# Patient Record
Sex: Male | Born: 1985 | Race: Black or African American | Hispanic: No | Marital: Single | Smoking: Never smoker
Health system: Southern US, Community
[De-identification: ages and names within clinical notes are randomized; demographics above are authoritative.]

## PROBLEM LIST (undated history)

## (undated) HISTORY — PX: EYE SURGERY: SHX253

## (undated) HISTORY — PX: OTHER SURGICAL HISTORY: SHX169

---
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
## 2005-09-13 ENCOUNTER — Emergency Department (HOSPITAL_COMMUNITY): Admission: EM | Admit: 2005-09-13 | Discharge: 2005-09-13 | Payer: Self-pay | Admitting: Emergency Medicine

## 2005-09-27 ENCOUNTER — Emergency Department (HOSPITAL_COMMUNITY): Admission: EM | Admit: 2005-09-27 | Discharge: 2005-09-27 | Payer: Self-pay | Admitting: Family Medicine

## 2005-09-29 ENCOUNTER — Emergency Department (HOSPITAL_COMMUNITY): Admission: EM | Admit: 2005-09-29 | Discharge: 2005-09-29 | Payer: Self-pay | Admitting: Emergency Medicine

## 2005-10-01 ENCOUNTER — Emergency Department (HOSPITAL_COMMUNITY): Admission: EM | Admit: 2005-10-01 | Discharge: 2005-10-02 | Payer: Self-pay | Admitting: Emergency Medicine

## 2005-10-03 ENCOUNTER — Inpatient Hospital Stay (HOSPITAL_COMMUNITY): Admission: EM | Admit: 2005-10-03 | Discharge: 2005-10-06 | Payer: Self-pay | Admitting: Emergency Medicine

## 2005-10-03 ENCOUNTER — Ambulatory Visit: Payer: Self-pay | Admitting: Family Medicine

## 2006-01-09 ENCOUNTER — Emergency Department (HOSPITAL_COMMUNITY): Admission: EM | Admit: 2006-01-09 | Discharge: 2006-01-09 | Payer: Self-pay | Admitting: Family Medicine

## 2006-02-11 ENCOUNTER — Emergency Department (HOSPITAL_COMMUNITY): Admission: EM | Admit: 2006-02-11 | Discharge: 2006-02-11 | Payer: Self-pay | Admitting: Family Medicine

## 2006-04-11 DIAGNOSIS — K589 Irritable bowel syndrome without diarrhea: Secondary | ICD-10-CM

## 2006-08-21 ENCOUNTER — Emergency Department (HOSPITAL_COMMUNITY): Admission: EM | Admit: 2006-08-21 | Discharge: 2006-08-22 | Payer: Self-pay | Admitting: Emergency Medicine

## 2006-10-09 ENCOUNTER — Emergency Department (HOSPITAL_COMMUNITY): Admission: EM | Admit: 2006-10-09 | Discharge: 2006-10-09 | Payer: Self-pay | Admitting: Emergency Medicine

## 2007-06-22 ENCOUNTER — Emergency Department (HOSPITAL_COMMUNITY): Admission: EM | Admit: 2007-06-22 | Discharge: 2007-06-23 | Payer: Self-pay | Admitting: Emergency Medicine

## 2007-06-22 ENCOUNTER — Emergency Department (HOSPITAL_COMMUNITY): Admission: EM | Admit: 2007-06-22 | Discharge: 2007-06-22 | Payer: Self-pay | Admitting: Family Medicine

## 2009-05-20 ENCOUNTER — Emergency Department (HOSPITAL_COMMUNITY): Admission: EM | Admit: 2009-05-20 | Discharge: 2009-05-21 | Payer: Self-pay | Admitting: Emergency Medicine

## 2010-05-03 LAB — CBC
HCT: 42.2 % (ref 39.0–52.0)
MCHC: 33.8 g/dL (ref 30.0–36.0)
MCV: 91.4 fL (ref 78.0–100.0)
Platelets: 169 10*3/uL (ref 150–400)
RDW: 12.6 % (ref 11.5–15.5)

## 2010-05-03 LAB — DIFFERENTIAL
Basophils Absolute: 0 10*3/uL (ref 0.0–0.1)
Basophils Relative: 0 % (ref 0–1)
Eosinophils Absolute: 0 10*3/uL (ref 0.0–0.7)
Eosinophils Relative: 0 % (ref 0–5)
Lymphocytes Relative: 9 % — ABNORMAL LOW (ref 12–46)
Monocytes Absolute: 1 10*3/uL (ref 0.1–1.0)

## 2010-05-03 LAB — POCT I-STAT, CHEM 8
Calcium, Ion: 1.08 mmol/L — ABNORMAL LOW (ref 1.12–1.32)
HCT: 43 % (ref 39.0–52.0)
Hemoglobin: 14.6 g/dL (ref 13.0–17.0)
Sodium: 138 mEq/L (ref 135–145)
TCO2: 27 mmol/L (ref 0–100)

## 2010-06-30 NOTE — Op Note (Signed)
Lawrence Harris, Lawrence Harris            ACCOUNT NO.:  [id number]   MEDICAL RECORD NO.:  [id number]          PATIENT TYPE:  INP   LOCATION:  5127                         FACILITY:  [facility]   PHYSICIAN:  Kinnie Scales. Annalee Genta, M.D.DATE OF BIRTH:  [birth year]   DATE OF PROCEDURE:  10/03/2005  DATE OF DISCHARGE:                                 OPERATIVE REPORT   PRE AND POSTOPERATIVE DIAGNOSIS AND INDICATIONS FOR SURGERY:  Lower lip  abscess.   PROCEDURES:  Incision and drainage of lower lip abscess.   SURGEON:  Kinnie Scales. Annalee Genta, M.D.   ANESTHESIA:  General.   SPECIMEN:  Including culture sensitivity sent to microbiology.   No complications.  Blood loss minimal.  The patient transferred to the  operating room to recovery in stable condition.  The patient also has  multiple additional cutaneous abscesses which were incised and drained by  Dr. Chevis Pretty as a separate surgical procedure.   BRIEF HISTORY:  The patient is a 26 year old black male without significant  past medical history. He reports 1-week history of swelling of the lower lip  and multiple additional cutaneous lesions.  He was seen as outpatient at the  [facility] outpatient family practice clinic and was cultured  diagnosed with MRSA, started on oral antibiotic therapy and despite this  continued to progress and was admitted to hospital 10/03/2005 with low grade  fever and elevated white blood cell count. Examination showed a significant  amount of swelling, erythema and tenderness of the lower lip, no other  lesions within the head and neck region.  Given his history and physical  examination,  I recommended incision and drainage of the lower lip abscess  in order to improve response to antibiotic therapy.  Risks, benefits and  possible complications of procedure were discussed in detail with the  patient's family, they understood and concurred with our plan for surgery  which is scheduled on a semi-urgent basis at  [facility] Main OR.   SURGICAL PROCEDURES:  The patient brought to the operating room in the main  OR at [facility] 10/03/2005.  General endotracheal anesthesia was  established without difficulty.  The patient's lip was cleansed with a  Betadine solution and he was examined.  Digital pressure showed significant  areas of purulent discharge along the inferior border of the lower lip at  the junction with the cutaneous skin.  He also had an area in the mid aspect  of the lip which is also leaking a moderate amount of purulent material.  A  hemostat was then passed through these areas and a significant amount of pus  was drained from the lip.  This was sent for culture and sensitivity, Gram  stain, aerobic, anaerobic cultures.  All purulent material was expressed  from the wound and there was a small amount of  bleeding.  A quarter inch Penrose drain was placed through the wound and  sutured into position with a 5-0 Ethilon suture.  The patient was awakened  from his anesthetic, extubated, transferred from the operating room to  recovery room in stable condition.  ______________________________  Kinnie Scales Annalee Genta, M.D.     DLS/MEDQ  D:  16/11/9602  T:  10/04/2005  Job:  [id number]
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
## 2010-06-30 NOTE — Discharge Summary (Signed)
NAMEHUTCH, RHETT            ACCOUNT NO.:  [id number]   MEDICAL RECORD NO.:  [id number]          PATIENT TYPE:  INP   LOCATION:  5127                         FACILITY:  [facility]   PHYSICIAN:  Rolm Gala, M.D.    DATE OF BIRTH:  1985/03/09   DATE OF ADMISSION:  10/02/2005  DATE OF DISCHARGE:                                 DISCHARGE SUMMARY   ADMISSION DATE:  October 03, 2005.   DISCHARGE DATE:  October 06, 2005.   DICTATING PHYSICIAN:  Dr. Rolm Gala.   ATTENDING PHYSICIAN:  Dr. Mardelle Matte.   PRIMARY CARE PHYSICIAN:  Dr. Rolm Gala at [facility]   CONSULTING PHYSICIAN:  1. Dr. Carolynne Edouard with [facility].  2. Dr. Annalee Genta with [facility].   DISCHARGE DIAGNOSES:  1. Multiple MRSA abscesses including 4 on abdomen, 1 on lip, and a few on      his legs.  2. History of irritable bowel syndrome.   PRINCIPAL PROCEDURES:  On October 03, 2005 ENT surgery drained the abscesses  on his abdomen, legs, and lip.   HOSPITAL COURSE:  This is a 25 year old male, who came into the ER on October 03, 2005 with multiple abscesses on his abdomen that had been minimally  drained by an urgent care center.  We were not able to drain these abscesses  ourselves due to pain tolerance.   PROBLEM #1:  MRSA abscesses.  Patient was started on vancomycin IV here to  cover for MRSA.  He was put on contact precautions.  ENT was consulted for a  abscess on his lip and surgery was consulted for abscesses on multiple  places on his body.  We were not able to drain these abscesses ourselves due  to pain tolerance.  Patient was put under general anesthesia and these were  drained.  The abscesses were packed daily.  Patient was given oxycodone,  Toradol, and Tylenol for pain.  He was discharged on Vicodin 5/500 because  he said that oxycodone makes him nauseous and Vicodin does not ([facility]).  Patient was also complaining of occasional nausea so he was discharged on  Phenergan 12.5 p.r.n.   patient was switched from vancomycin to Septra DS  p.o. on October 05, 2005, the day before discharge.  At discharge he was sent  home on Septra DS, 2 p.o. b.i.d. for a total of 10 days.  He will follow up  with both ENT and surgery.   PROBLEM #2:  Irritable bowel syndrome.  Patient was continued on his home  dose of Levsin.   FOLLOWUP:  Patient will see Dr. Carolynne Edouard in 2 weeks and Dr. Annalee Genta in 10  days.  He will follow up at the [facility] in 10 days to 2  weeks.   LABORATORY DATA:  Blood cultures from October 03, 2005 were negative.  A  routine culture from his wounds from October 03, 2005 grew MRSA that was  sensitive to Septra but not tetracycline.  His white blood cells on  admission were 25, at discharge they had come down to 10.7.   DISCHARGE MEDICATIONS:  1. Vicodin  5/500, take 1-2 q.4 p.r.n., #30.  2. Phenergan 12.5 mg, take 1 q.6 p.r.n. nausea, #5.  3. Septra DS, take 2 pills p.o. b.i.d. to finish a 10-day course, 7 days      left.  4. Levsin 0.125-0.25 mg p.o. q.6 p.r.n.      Rolm Gala, M.D.     Bennetta Laos  D:  10/06/2005  T:  10/06/2005  Job:  [id number]   cc:   Rolm Gala, M.D.  Annalee Genta, Dr.  Ollen Gross. Vernell Morgans, M.D.

## 2010-06-30 NOTE — Op Note (Signed)
NAMELAMAR, METER            ACCOUNT NO.:  [id number]   MEDICAL RECORD NO.:  [id number]          PATIENT TYPE:  INP   LOCATION:  5127                         FACILITY:  [facility]   PHYSICIAN:  Ollen Gross. Vernell Morgans, M.D. DATE OF BIRTH:  October 19, 1985   DATE OF PROCEDURE:  10/03/2005  DATE OF DISCHARGE:                                 OPERATIVE REPORT   PREOPERATIVE DIAGNOSIS:  Multiple abscesses on the abdomen, right axilla,  and bilateral legs.   POSTOPERATIVE DIAGNOSIS:  Multiple abscesses on the abdomen, right axilla,  and bilateral legs.   PROCEDURES:  Incision and drainage of each of these abscesses.   SURGEON:  Ollen Gross. Carolynne Edouard, M.D.   ANESTHESIA:  General endotracheal.   PROCEDURE:  After informed consent was obtained, the patient was brought to  the operating room and placed in supine position on the operating room  table.  After induction of general anesthesia, each of these areas of  abscess were prepped with Betadine and draped in the usual sterile manner.  Each of these abscesses was opened with a 15 blade knife.  Hemostasis was  achieved using the Bovie electrocautery.  Each of these wounds was then  cleaned with gauze and then packed with clean 2 x 2 gauze and sterile  dressings were applied.  The patient tolerated the procedure well.  At the  end of the case all needle, sponge and instrument counts were correct.  The  patient was then awakened and taken to recovery in stable condition.      Ollen Gross. Vernell Morgans, M.D.  Electronically Signed     PST/MEDQ  D:  10/04/2005  T:  10/05/2005  Job:  [id number]

## 2010-11-24 LAB — DIFFERENTIAL
Basophils Absolute: 0.1
Basophils Relative: 1
Lymphocytes Relative: 28
Monocytes Absolute: 0.7
Monocytes Relative: 8
Neutro Abs: 5.8
Neutrophils Relative %: 62

## 2010-11-24 LAB — BASIC METABOLIC PANEL
Calcium: 9
Creatinine, Ser: 0.83
GFR calc Af Amer: >60
GFR calc non Af Amer: >60
Sodium: 139

## 2010-11-24 LAB — CBC
Hemoglobin: 15.1
RBC: 4.94
RDW: 13.5

## 2013-07-16 ENCOUNTER — Encounter (HOSPITAL_BASED_OUTPATIENT_CLINIC_OR_DEPARTMENT_OTHER): Payer: Self-pay | Admitting: Emergency Medicine

## 2013-07-16 ENCOUNTER — Emergency Department (HOSPITAL_BASED_OUTPATIENT_CLINIC_OR_DEPARTMENT_OTHER)
Admission: EM | Admit: 2013-07-16 | Discharge: 2013-07-16 | Disposition: A | Discharge status: Home / Self Care | Payer: Self-pay | Attending: Emergency Medicine | Admitting: Emergency Medicine

## 2013-07-16 ENCOUNTER — Emergency Department (HOSPITAL_BASED_OUTPATIENT_CLINIC_OR_DEPARTMENT_OTHER): Payer: Self-pay

## 2013-07-16 DIAGNOSIS — W2209XA Striking against other stationary object, initial encounter: Secondary | ICD-10-CM | POA: Insufficient documentation

## 2013-07-16 DIAGNOSIS — S60221A Contusion of right hand, initial encounter: Secondary | ICD-10-CM

## 2013-07-16 DIAGNOSIS — S60229A Contusion of unspecified hand, initial encounter: Secondary | ICD-10-CM | POA: Insufficient documentation

## 2013-07-16 DIAGNOSIS — Y9289 Other specified places as the place of occurrence of the external cause: Secondary | ICD-10-CM | POA: Insufficient documentation

## 2013-07-16 DIAGNOSIS — Y9389 Activity, other specified: Secondary | ICD-10-CM | POA: Insufficient documentation

## 2013-07-16 MED ORDER — TRAMADOL HCL 50 MG PO TABS: 50.0000 mg | ORAL_TABLET | Freq: Four times a day (QID) | ORAL | Status: DC | PRN
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
## 2013-07-16 NOTE — Discharge Instructions (Signed)
Take ibuprofen or acetaminophen as needed for pain. Wear splint as needed. Follow up with the hand specialist if not improving in the next week.  Hand Contusion A hand contusion is a deep bruise on your hand area. Contusions are the result of an injury that caused bleeding under the skin. The contusion may turn blue, purple, or yellow. Minor injuries will give you a painless contusion, but more severe contusions may stay painful and swollen for a few weeks. CAUSES  A contusion is usually caused by a blow, trauma, or direct force to an area of the body. SYMPTOMS   Swelling and redness of the injured area.  Discoloration of the injured area.  Tenderness and soreness of the injured area.  Pain. DIAGNOSIS  The diagnosis can be made by taking a history and performing a physical exam. An X-ray, CT scan, or MRI may be needed to determine if there were any associated injuries, such as broken bones (fractures). TREATMENT  Often, the best treatment for a hand contusion is resting, elevating, icing, and applying cold compresses to the injured area. Over-the-counter medicines may also be recommended for pain control. HOME CARE INSTRUCTIONS   Put ice on the injured area.  Put ice in a plastic bag.  Place a towel between your skin and the bag.  Leave the ice on for 15-20 minutes, 03-04 times a day.  Only take over-the-counter or prescription medicines as directed by your caregiver. Your caregiver may recommend avoiding anti-inflammatory medicines (aspirin, ibuprofen, and naproxen) for 48 hours because these medicines may increase bruising.  If told, use an elastic wrap as directed. This can help reduce swelling. You may remove the wrap for sleeping, showering, and bathing. If your fingers become numb, cold, or blue, take the wrap off and reapply it more loosely.  Elevate your hand with pillows to reduce swelling.  Avoid overusing your hand if it is painful. SEEK IMMEDIATE MEDICAL CARE IF:    You have increased redness, swelling, or pain in your hand.  Your swelling or pain is not relieved with medicines.  You have loss of feeling in your hand or are unable to move your fingers.  Your hand turns cold or blue.  You have pain when you move your fingers.  Your hand becomes warm to the touch.  Your contusion does not improve in 2 days. MAKE SURE YOU:   Understand these instructions.  Will watch your condition.  Will get help right away if you are not doing well or get worse. Document Released: 07/21/2001 Document Revised: 10/24/2011 Document Reviewed: 07/23/2011 [facility] Patient Information 2014 [place], [place].  Tramadol tablets What is this medicine? TRAMADOL (TRA ma dole) is a pain reliever. It is used to treat moderate to severe pain in adults. This medicine may be used for other purposes; ask your health care provider or pharmacist if you have questions. COMMON BRAND NAME(S): Ultram What should I tell my health care provider before I take this medicine? They need to know if you have any of these conditions: -brain tumor -depression -drug abuse or addiction -head injury -if you frequently drink alcohol containing drinks -kidney disease or trouble passing urine -liver disease -lung disease, asthma, or breathing problems -seizures or epilepsy -suicidal thoughts, plans, or attempt; a previous suicide attempt by you or a family member -an unusual or allergic reaction to tramadol, codeine, other medicines, foods, dyes, or preservatives -pregnant or trying to get pregnant -breast-feeding How should I use this medicine? Take this medicine by mouth  with a full glass of water. Follow the directions on the prescription label. If the medicine upsets your stomach, take it with food or milk. Do not take more medicine than you are told to take. Talk to your pediatrician regarding the use of this medicine in children. Special care may be needed. Overdosage: If you think  you have taken too much of this medicine contact a poison control center or emergency room at once. NOTE: This medicine is only for you. Do not share this medicine with others. What if I miss a dose? If you miss a dose, take it as soon as you can. If it is almost time for your next dose, take only that dose. Do not take double or extra doses. What may interact with this medicine? Do not take this medicine with any of the following medications: -MAOIs like Carbex, Eldepryl, Marplan, Nardil, and Parnate This medicine may also interact with the following medications: -alcohol or medicines that contain alcohol -antihistamines -benzodiazepines -bupropion -carbamazepine or oxcarbazepine -clozapine -cyclobenzaprine -digoxin -furazolidone -linezolid -medicines for depression, anxiety, or psychotic disturbances -medicines for migraine headache like almotriptan, eletriptan, frovatriptan, naratriptan, rizatriptan, sumatriptan, zolmitriptan -medicines for pain like pentazocine, buprenorphine, butorphanol, meperidine, nalbuphine, and propoxyphene -medicines for sleep -muscle relaxants -naltrexone -phenobarbital -phenothiazines like perphenazine, thioridazine, chlorpromazine, mesoridazine, fluphenazine, prochlorperazine, promazine, and trifluoperazine -procarbazine -warfarin This list may not describe all possible interactions. Give your health care provider a list of all the medicines, herbs, non-prescription drugs, or dietary supplements you use. Also tell them if you smoke, drink alcohol, or use illegal drugs. Some items may interact with your medicine. What should I watch for while using this medicine? Tell your doctor or health care professional if your pain does not go away, if it gets worse, or if you have new or a different type of pain. You may develop tolerance to the medicine. Tolerance means that you will need a higher dose of the medicine for pain relief. Tolerance is normal and is  expected if you take this medicine for a long time. Do not suddenly stop taking your medicine because you may develop a severe reaction. Your body becomes used to the medicine. This does NOT mean you are addicted. Addiction is a behavior related to getting and using a drug for a non-medical reason. If you have pain, you have a medical reason to take pain medicine. Your doctor will tell you how much medicine to take. If your doctor wants you to stop the medicine, the dose will be slowly lowered over time to avoid any side effects. You may get drowsy or dizzy. Do not drive, use machinery, or do anything that needs mental alertness until you know how this medicine affects you. Do not stand or sit up quickly, especially if you are an older patient. This reduces the risk of dizzy or fainting spells. Alcohol can increase or decrease the effects of this medicine. Avoid alcoholic drinks. You may have constipation. Try to have a bowel movement at least every 2 to 3 days. If you do not have a bowel movement for 3 days, call your doctor or health care professional. Your mouth may get dry. Chewing sugarless gum or sucking hard candy, and drinking plenty of water may help. Contact your doctor if the problem does not go away or is severe. What side effects may I notice from receiving this medicine? Side effects that you should report to your doctor or health care professional as soon as possible: -allergic reactions like skin rash,  itching or hives, swelling of the face, lips, or tongue -breathing difficulties, wheezing -confusion -itching -light headedness or fainting spells -redness, blistering, peeling or loosening of the skin, including inside the mouth -seizures Side effects that usually do not require medical attention (report to your doctor or health care professional if they continue or are bothersome): -constipation -dizziness -drowsiness -headache -nausea, vomiting This list may not describe all  possible side effects. Call your doctor for medical advice about side effects. You may report side effects to FDA at 1-800-FDA-1088. Where should I keep my medicine? Keep out of the reach of children. Store at room temperature between 15 and 30 degrees C (59 and 86 degrees F). Keep container tightly closed. Throw away any unused medicine after the expiration date. NOTE: This sheet is a summary. It may not cover all possible information. If you have questions about this medicine, talk to your doctor, pharmacist, or health care provider.  2014, [employer]. (2009-10-12 11:55:44)

## 2013-08-10 ENCOUNTER — Encounter (HOSPITAL_BASED_OUTPATIENT_CLINIC_OR_DEPARTMENT_OTHER): Payer: Self-pay | Admitting: Emergency Medicine

## 2013-08-10 ENCOUNTER — Emergency Department (HOSPITAL_BASED_OUTPATIENT_CLINIC_OR_DEPARTMENT_OTHER)
Admission: EM | Admit: 2013-08-10 | Discharge: 2013-08-10 | Disposition: A | Discharge status: Home / Self Care | Payer: Self-pay | Attending: Emergency Medicine | Admitting: Emergency Medicine

## 2013-08-10 DIAGNOSIS — M25549 Pain in joints of unspecified hand: Secondary | ICD-10-CM | POA: Insufficient documentation

## 2013-08-10 DIAGNOSIS — M79641 Pain in right hand: Secondary | ICD-10-CM

## 2013-08-10 NOTE — ED Notes (Addendum)
Pt c/o right hand pain x 3 weeks seen here 6/4 for same , pt reports did not f/u with ortho and lost " brace"  And is out of tramadol

## 2013-08-10 NOTE — Discharge Instructions (Signed)

## 2013-08-10 NOTE — ED Provider Notes (Signed)
Medical screening examination/treatment/procedure(s) were performed by non-physician practitioner and as supervising physician I was immediately available for consultation/collaboration.   Tamarius Rosenfield, MD 08/10/13 2329 

## 2013-08-10 NOTE — ED Provider Notes (Signed)
CSN: [id number]     Arrival date & time 08/10/13  1417 History   First MD Initiated Contact with Patient 08/10/13 1538     Chief Complaint  Patient presents with  . Hand Pain     (Consider location/radiation/quality/duration/timing/severity/associated sxs/prior Treatment) HPI Comments: Pt states that after punching something a couple of weeks ago he was splinted as the xray was normal and he has lost the splint and told to follow up with ortho which he decided not to do; states that he is continuing to have pain in the hand and some times when he grips things he get shooting pains in his hand  Patient is a 28 y.o. male presenting with hand pain. The history is provided by the patient. No language interpreter was used.  Hand Pain This is a recurrent problem. The current episode started 1 to 4 weeks ago. The problem occurs constantly. The problem has been unchanged. Pertinent negatives include no fever, numbness or weakness. The symptoms are aggravated by twisting. He has tried immobilization for the symptoms.    History reviewed. No pertinent past medical history. Past Surgical History  Procedure Laterality Date  . Eye surgery     History reviewed. No pertinent family history. History  Substance Use Topics  . Smoking status: Never Smoker   . Smokeless tobacco: Not on file  . Alcohol Use: No    Review of Systems  Constitutional: Negative for fever.  Respiratory: Negative.   Cardiovascular: Negative.   Neurological: Negative for weakness and numbness.      Allergies  Review of patient's allergies indicates no known allergies.  Home Medications   Prior to Admission medications   Not on File   BP 147/83  Pulse 82  Temp(Src) 98.6 F (37 C) (Oral)  Resp 18  SpO2 98% Physical Exam  Nursing note and vitals reviewed. Constitutional: He is oriented to person, place, and time. He appears well-developed and well-nourished.  Cardiovascular: Normal rate and regular rhythm.    Pulmonary/Chest: Effort normal and breath sounds normal.  Musculoskeletal:  No obvious swelling or deformity to the right hand. Pt has full rom. Equal grip strength. Neurovascularly intact  Neurological: He is alert and oriented to person, place, and time. He exhibits normal muscle tone. Coordination normal.  Skin: Skin is warm and dry.    ED Course  Procedures (including critical care time) Labs Review Labs Reviewed - No data to display  Imaging Review No results found.   EKG Interpretation None      MDM   Final diagnoses:  Pain of right hand    Pt is neurologically intact. Pt placed in another velcro splint and referred to Dr. Pearletha Forgehudnall for follow up    Teressa LowerVrinda Pickering, NP 08/10/13 1615

## 2013-10-21 ENCOUNTER — Emergency Department (HOSPITAL_BASED_OUTPATIENT_CLINIC_OR_DEPARTMENT_OTHER)
Admission: EM | Admit: 2013-10-21 | Discharge: 2013-10-21 | Disposition: A | Discharge status: Home / Self Care | Payer: Self-pay | Attending: Emergency Medicine | Admitting: Emergency Medicine

## 2013-10-21 ENCOUNTER — Encounter (HOSPITAL_BASED_OUTPATIENT_CLINIC_OR_DEPARTMENT_OTHER): Payer: Self-pay | Admitting: Emergency Medicine

## 2013-10-21 DIAGNOSIS — Y929 Unspecified place or not applicable: Secondary | ICD-10-CM | POA: Insufficient documentation

## 2013-10-21 DIAGNOSIS — Y939 Activity, unspecified: Secondary | ICD-10-CM | POA: Insufficient documentation

## 2013-10-21 DIAGNOSIS — R112 Nausea with vomiting, unspecified: Secondary | ICD-10-CM | POA: Insufficient documentation

## 2013-10-21 DIAGNOSIS — T628X1A Toxic effect of other specified noxious substances eaten as food, accidental (unintentional), initial encounter: Secondary | ICD-10-CM | POA: Insufficient documentation

## 2013-10-21 DIAGNOSIS — A059 Bacterial foodborne intoxication, unspecified: Secondary | ICD-10-CM | POA: Insufficient documentation

## 2013-10-21 LAB — BASIC METABOLIC PANEL
ANION GAP: 10 (ref 5–15)
BUN: 12 mg/dL (ref 6–23)
CALCIUM: 9.3 mg/dL (ref 8.4–10.5)
CO2: 24 mEq/L (ref 19–32)
Chloride: 104 mEq/L (ref 96–112)
Creatinine, Ser: 0.7 mg/dL (ref 0.50–1.35)
Glucose, Bld: 111 mg/dL — ABNORMAL HIGH (ref 70–99)
Potassium: 4 mEq/L (ref 3.7–5.3)
SODIUM: 138 meq/L (ref 137–147)

## 2013-10-21 LAB — CBC WITH DIFFERENTIAL/PLATELET
BASOS ABS: 0 10*3/uL (ref 0.0–0.1)
Basophils Relative: 1 % (ref 0–1)
EOS ABS: 0.1 10*3/uL (ref 0.0–0.7)
EOS PCT: 2 % (ref 0–5)
HCT: 42.7 % (ref 39.0–52.0)
Hemoglobin: 15.1 g/dL (ref 13.0–17.0)
LYMPHS PCT: 39 % (ref 12–46)
Lymphs Abs: 2.9 10*3/uL (ref 0.7–4.0)
MCH: 30.6 pg (ref 26.0–34.0)
MCHC: 35.4 g/dL (ref 30.0–36.0)
MCV: 86.6 fL (ref 78.0–100.0)
Monocytes Absolute: 0.7 10*3/uL (ref 0.1–1.0)
Monocytes Relative: 9 % (ref 3–12)
Neutro Abs: 3.6 10*3/uL (ref 1.7–7.7)
Neutrophils Relative %: 49 % (ref 43–77)
PLATELETS: 193 10*3/uL (ref 150–400)
RBC: 4.93 MIL/uL (ref 4.22–5.81)
RDW: 12.6 % (ref 11.5–15.5)
WBC: 7.3 10*3/uL (ref 4.0–10.5)

## 2013-10-21 MED ORDER — ONDANSETRON HCL 4 MG/2ML IJ SOLN
4.0000 mg | Freq: Once | INTRAMUSCULAR | Status: AC
  Administered 2013-10-21: 4 mg via INTRAVENOUS
  Filled 2013-10-21: qty 2

## 2013-10-21 MED ORDER — PROMETHAZINE HCL 25 MG PO TABS: 25.0000 mg | ORAL_TABLET | Freq: Four times a day (QID) | ORAL | Status: DC | PRN

## 2013-10-21 MED ORDER — FENTANYL CITRATE 0.05 MG/ML IJ SOLN
100.0000 ug | Freq: Once | INTRAMUSCULAR | Status: DC
  Filled 2013-10-21: qty 2

## 2013-10-21 MED ORDER — SODIUM CHLORIDE 0.9 % IV BOLUS (SEPSIS)
1000.0000 mL | Freq: Once | INTRAVENOUS | Status: AC
  Administered 2013-10-21: 1000 mL via INTRAVENOUS

## 2013-10-21 MED ORDER — OXYCODONE-ACETAMINOPHEN 7.5-325 MG PO TABS: 1.0000 | ORAL_TABLET | ORAL | Status: DC | PRN

## 2013-10-21 NOTE — ED Provider Notes (Signed)
CSN: [id number]     Arrival date & time 10/21/13  1132 History   First MD Initiated Contact with Patient 10/21/13 1147     Chief Complaint  Patient presents with  . Nausea      HPI C/o n/v/d and abd cramps since yesterday and 0100. States he ate some shrimp that he thinks was bad. No fever.States his wife ate the shrimp and is also sick. Nobody else ate the shrimp and no one else in the house is sick.  History reviewed. No pertinent past medical history. Past Surgical History  Procedure Laterality Date  . Eye surgery     No family history on file. History  Substance Use Topics  . Smoking status: Never Smoker   . Smokeless tobacco: Not on file  . Alcohol Use: No    Review of Systems  All other systems reviewed and are negative   Allergies  Review of patient's allergies indicates no known allergies.  Home Medications   Prior to Admission medications   Medication Sig Start Date End Date Taking? Authorizing Provider  oxyCODONE-acetaminophen (PERCOCET) 7.5-325 MG per tablet Take 1 tablet by mouth every 4 (four) hours as needed for pain. 10/21/13   Nelia Shi, MD  promethazine (PHENERGAN) 25 MG tablet Take 1 tablet (25 mg total) by mouth every 6 (six) hours as needed for nausea or vomiting. 10/21/13   Nelia Shi, MD   BP 128/67  Pulse 89  Temp(Src) 98.5 F (36.9 C) (Oral)  Resp 18  Ht  (1.778 m)  Wt 213 lb 7 oz (96.815 kg)  BMI 30.63 kg/m2  SpO2 100% Physical Exam Physical Exam  Nursing note and vitals reviewed. Constitutional: He is oriented to person, place, and time. He appears well-developed and well-nourished. No distress.  HENT:  Head: Normocephalic and atraumatic.  Eyes: Pupils are equal, round, and reactive to light.  Neck: Normal range of motion.  Cardiovascular: Normal rate and intact distal pulses.   Pulmonary/Chest: No respiratory distress.  Abdominal: Normal appearance. He exhibits no distension.  Musculoskeletal: Normal range of motion.   Neurological: He is alert and oriented to person, place, and time. No cranial nerve deficit.  Skin: Skin is warm and dry. No rash noted.  Psychiatric: He has a normal mood and affect. His behavior is normal.   ED Course  Procedures (including critical care time) Medications  sodium chloride 0.9 % bolus 1,000 mL (1,000 mLs Intravenous New Bag/Given 10/21/13 1226)  ondansetron (ZOFRAN) injection 4 mg (4 mg Intravenous Given 10/21/13 1219)    Labs Review Labs Reviewed  BASIC METABOLIC PANEL - Abnormal; Notable for the following:    Glucose, Bld 111 (*)    All other components within normal limits  CBC WITH DIFFERENTIAL         MDM   Final diagnoses:  Food poisoning        Nelia Shi, MD 10/21/13 1236

## 2013-10-21 NOTE — ED Notes (Addendum)
C/o n/v/d and abd cramps since yesterday and 0100. States he ate some shrimp that he thinks was bad. No fever.States his wife ate the shrimp and is also sick. Nobody else ate the shrimp and no one else in the house is sick.

## 2013-10-21 NOTE — Discharge Instructions (Signed)
Food Poisoning °Food poisoning is an illness caused by something you ate or drank. There are over 250 known causes of food poisoning. However, many other causes are unknown. You can be treated even if the exact cause of your food poisoning is not known. In most cases, food poisoning is mild and lasts 1 to 2 days. However, some cases can be serious, especially for people with low immune systems, the elderly, children and infants, and pregnant women. °CAUSES  °Poor personal hygiene, improper cleaning of storage and preparation areas, and unclean utensils can cause infection or tainting (contamination) of foods. The causes of food poisoning are numerous. Infectious agents, such as viruses, bacteria, or parasites, can cause harm by infecting the intestine and disrupting the absorption of nutrients and water. This can cause diarrhea and lead to dehydration. Viruses are responsible for most of the food poisonings in which an agent is found. Parasites are less likely to cause food poisoning. Toxic agents, such as poisonous mushrooms, marine algae, and pesticides can also cause food poisoning. °· Viral causes of food poisoning include: °¨ Norovirus. °¨ Rotavirus. °¨ Hepatitis A. °· Bacterial causes of food poisoning include: °¨ Salmonellae. °¨ Campylobacter. °¨ Bacillus cereus. °¨ Escherichia coli (E. coli). °¨ Shigella. °¨ Listeria monocytogenes. °¨ Clostridium botulinum (botulism). °¨ Vibrio cholerae. °· Parasites that can cause food poisoning include: °¨ Giardia. °¨ Cryptosporidium. °¨ Toxoplasma. °SYMPTOMS °Symptoms may appear several hours or longer after consuming the contaminated food or drink. Symptoms may include: °· Nausea. °· Vomiting. °· Cramping. °· Diarrhea. °· Fever and chills. °· Muscle aches. °DIAGNOSIS °Your health care provider may be able to diagnose food poisoning from a list of what you have recently eaten and results from lab tests. Diagnostic tests may include an exam of the feces. °TREATMENT °In  most cases, treatment focuses on helping to relieve your symptoms and staying well hydrated. Antibiotic medicines are rarely needed. In severe cases, hospitalization may be required. °HOME CARE INSTRUCTIONS  °· Drink enough water and fluids to keep your urine clear or pale yellow. Drink small amounts of fluids frequently and increase as tolerated. °· Ask your health care provider for specific rehydration instructions. °· Avoid: °¨ Foods high in sugar. °¨ Alcohol. °¨ Carbonated drinks. °¨ Tobacco. °¨ Juice. °¨ Caffeine drinks. °¨ Extremely hot or cold fluids. °¨ Fatty, greasy foods. °¨ Too much intake of anything at one time. °¨ Dairy products until 24 to 48 hours after diarrhea stops. °· You may consume probiotics. Probiotics are active cultures of beneficial bacteria. They may lessen the amount and number of diarrheal stools in adults. Probiotics can be found in yogurt with active cultures and in supplements. °· Wash your hands well to avoid spreading the bacteria. °· Take medicines only as directed by your health care provider. Do not give your child aspirin because of the association with Reye's syndrome. °· Ask your health care provider if you should continue to take your regular prescribed and over-the-counter medicines. °PREVENTION  °· Wash your hands, food preparation surfaces, and utensils thoroughly before and after handling raw foods. °· Keep refrigerated foods below 40°F (5°C). °· Serve hot foods immediately or keep them heated above 140°F (60°C). °· Divide large volumes of food into small portions for rapid cooling in the refrigerator. Hot, bulky foods in the refrigerator can raise the temperature of other foods that have already cooled. °· Follow approved canning procedures. °· Heat canned foods thoroughly before tasting. °· When in doubt, throw it out. °· Infants, the elderly, women   who are pregnant, and people with compromised immune systems are especially susceptible to food poisoning. These people  should never consume unpasteurized cheese, unpasteurized cider, raw fish, raw seafood, or raw meat-type products. °SEEK IMMEDIATE MEDICAL CARE IF:  °· You have difficulty breathing, swallowing, talking, or moving. °· You develop blurred vision. °· You are unable to keep fluids down. °· You faint or nearly faint. °· Your eyes turn yellow. °· Vomiting or diarrhea develops or becomes persistent. °· Abdominal pain develops, increases, or localizes in one small area. °· You have a fever. °· The diarrhea becomes excessive or contains blood or mucus. °· You develop excessive weakness, dizziness, or extreme thirst. °· You have no urine for 8 hours. °MAKE SURE YOU:  °· Understand these instructions. °· Will watch your condition. °· Will get help right away if you are not doing well or get worse. °Document Released: 10/28/2003 Document Revised: 06/15/2013 Document Reviewed: 06/15/2010 °[employer]® Patient Information ©2015 [employer]. This information is not intended to replace advice given to you by your health care provider. Make sure you discuss any questions you have with your health care provider. ° °

## 2013-12-08 ENCOUNTER — Emergency Department (HOSPITAL_BASED_OUTPATIENT_CLINIC_OR_DEPARTMENT_OTHER): Payer: Self-pay

## 2013-12-08 ENCOUNTER — Encounter (HOSPITAL_BASED_OUTPATIENT_CLINIC_OR_DEPARTMENT_OTHER): Payer: Self-pay | Admitting: Emergency Medicine

## 2013-12-08 ENCOUNTER — Emergency Department (HOSPITAL_BASED_OUTPATIENT_CLINIC_OR_DEPARTMENT_OTHER)
Admission: EM | Admit: 2013-12-08 | Discharge: 2013-12-08 | Disposition: A | Discharge status: Home / Self Care | Payer: Self-pay | Attending: Emergency Medicine | Admitting: Emergency Medicine

## 2013-12-08 DIAGNOSIS — S39012A Strain of muscle, fascia and tendon of lower back, initial encounter: Secondary | ICD-10-CM | POA: Insufficient documentation

## 2013-12-08 DIAGNOSIS — M545 Low back pain, unspecified: Secondary | ICD-10-CM

## 2013-12-08 DIAGNOSIS — Y92410 Unspecified street and highway as the place of occurrence of the external cause: Secondary | ICD-10-CM | POA: Insufficient documentation

## 2013-12-08 DIAGNOSIS — Y9389 Activity, other specified: Secondary | ICD-10-CM | POA: Insufficient documentation

## 2013-12-08 DIAGNOSIS — R52 Pain, unspecified: Secondary | ICD-10-CM

## 2013-12-08 LAB — URINALYSIS, ROUTINE W REFLEX MICROSCOPIC
Bilirubin Urine: NEGATIVE
Glucose, UA: NEGATIVE mg/dL
Hgb urine dipstick: NEGATIVE
KETONES UR: NEGATIVE mg/dL
LEUKOCYTES UA: NEGATIVE
NITRITE: NEGATIVE
PROTEIN: NEGATIVE mg/dL
Specific Gravity, Urine: 1.026 (ref 1.005–1.030)
Urobilinogen, UA: 1 mg/dL (ref 0.0–1.0)
pH: 5.5 (ref 5.0–8.0)

## 2013-12-08 MED ORDER — IBUPROFEN 400 MG PO TABS
400.0000 mg | ORAL_TABLET | Freq: Once | ORAL | Status: AC
  Administered 2013-12-08: 400 mg via ORAL
  Filled 2013-12-08: qty 1

## 2013-12-08 MED ORDER — HYDROCODONE-ACETAMINOPHEN 5-325 MG PO TABS: 1.0000 | ORAL_TABLET | Freq: Four times a day (QID) | ORAL | Status: DC | PRN

## 2013-12-08 MED ORDER — HYDROCODONE-ACETAMINOPHEN 5-325 MG PO TABS
2.0000 | ORAL_TABLET | Freq: Once | ORAL | Status: DC
Start: 2013-12-08 — End: 2013-12-08
  Filled 2013-12-08: qty 2
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
## 2013-12-08 NOTE — Discharge Instructions (Signed)
It was our pleasure to provide your ER care today - we hope that you feel better.  Take motrin or aleve as need for pain. You may also take hydrocodone as need for pain. No driving for the next 6 hours or when taking hydrocodone. Also, do not take tylenol or acetaminophen containing medication when taking hydrocodone. Follow up with primary care doctor in 1 week if symptoms fail to improve/resolve.  Return to ER if worse, new symptoms, severe or intractable pain, fevers, numbness/weakness, other concern.    Lumbosacral Strain Lumbosacral strain is a strain of any of the parts that make up your lumbosacral vertebrae. Your lumbosacral vertebrae are the bones that make up the lower third of your backbone. Your lumbosacral vertebrae are held together by muscles and tough, fibrous tissue (ligaments).  CAUSES  A sudden blow to your back can cause lumbosacral strain. Also, anything that causes an excessive stretch of the muscles in the low back can cause this strain. This is typically seen when people exert themselves strenuously, fall, lift heavy objects, bend, or crouch repeatedly. RISK FACTORS  Physically demanding work.  Participation in pushing or pulling sports or sports that require a sudden twist of the back (tennis, golf, baseball).  Weight lifting.  Excessive lower back curvature.  Forward-tilted pelvis.  Weak back or abdominal muscles or both.  Tight hamstrings. SIGNS AND SYMPTOMS  Lumbosacral strain may cause pain in the area of your injury or pain that moves (radiates) down your leg.  DIAGNOSIS Your health care provider can often diagnose lumbosacral strain through a physical exam. In some cases, you may need tests such as X-ray exams.  TREATMENT  Treatment for your lower back injury depends on many factors that your clinician will have to evaluate. However, most treatment will include the use of anti-inflammatory medicines. HOME CARE INSTRUCTIONS   Avoid hard physical  activities (tennis, racquetball, waterskiing) if you are not in proper physical condition for it. This may aggravate or create problems.  If you have a back problem, avoid sports requiring sudden body movements. Swimming and walking are generally safer activities.  Maintain good posture.  Maintain a healthy weight.  For acute conditions, you may put ice on the injured area.  Put ice in a plastic bag.  Place a towel between your skin and the bag.  Leave the ice on for 20 minutes, 2-3 times a day.  When the low back starts healing, stretching and strengthening exercises may be recommended. SEEK MEDICAL CARE IF:  Your back pain is getting worse.  You experience severe back pain not relieved with medicines. SEEK IMMEDIATE MEDICAL CARE IF:   You have numbness, tingling, weakness, or problems with the use of your arms or legs.  There is a change in bowel or bladder control.  You have increasing pain in any area of the body, including your belly (abdomen).  You notice shortness of breath, dizziness, or feel faint.  You feel sick to your stomach (nauseous), are throwing up (vomiting), or become sweaty.  You notice discoloration of your toes or legs, or your feet get very cold. MAKE SURE YOU:   Understand these instructions.  Will watch your condition.  Will get help right away if you are not doing well or get worse. Document Released: 11/08/2004 Document Revised: 02/03/2013 Document Reviewed: 09/17/2012 [facility] Patient Information 2015 [place], [place]. This information is not intended to replace advice given to you by your health care provider. Make sure you discuss any questions you have  with your health care provider.   Back Pain, Adult Low back pain is very common. About 1 in 5 people have back pain.The cause of low back pain is rarely dangerous. The pain often gets better over time.About half of people with a sudden onset of back pain feel better in just 2 weeks. About 8  in 10 people feel better by 6 weeks.  CAUSES Some common causes of back pain include:  Strain of the muscles or ligaments supporting the spine.  Wear and tear (degeneration) of the spinal discs.  Arthritis.  Direct injury to the back. DIAGNOSIS Most of the time, the direct cause of low back pain is not known.However, back pain can be treated effectively even when the exact cause of the pain is unknown.Answering your caregiver's questions about your overall health and symptoms is one of the most accurate ways to make sure the cause of your pain is not dangerous. If your caregiver needs more information, he or she may order lab work or imaging tests (X-rays or MRIs).However, even if imaging tests show changes in your back, this usually does not require surgery. HOME CARE INSTRUCTIONS For many people, back pain returns.Since low back pain is rarely dangerous, it is often a condition that people can learn to [facility]ageon their own.   Remain active. It is stressful on the back to sit or stand in one place. Do not sit, drive, or stand in one place for more than 30 minutes at a time. Take short walks on level surfaces as soon as pain allows.Try to increase the length of time you walk each day.  Do not stay in bed.Resting more than 1 or 2 days can delay your recovery.  Do not avoid exercise or work.Your body is made to move.It is not dangerous to be active, even though your back may hurt.Your back will likely heal faster if you return to being active before your pain is gone.  Pay attention to your body when you bend and lift. Many people have less discomfortwhen lifting if they bend their knees, keep the load close to their bodies,and avoid twisting. Often, the most comfortable positions are those that put less stress on your recovering back.  Find a comfortable position to sleep. Use a firm mattress and lie on your side with your knees slightly bent. If you lie on your back, put a pillow  under your knees.  Only take over-the-counter or prescription medicines as directed by your caregiver. Over-the-counter medicines to reduce pain and inflammation are often the most helpful.Your caregiver may prescribe muscle relaxant drugs.These medicines help dull your pain so you can more quickly return to your normal activities and healthy exercise.  Put ice on the injured area.  Put ice in a plastic bag.  Place a towel between your skin and the bag.  Leave the ice on for 15-20 minutes, 03-04 times a day for the first 2 to 3 days. After that, ice and heat may be alternated to reduce pain and spasms.  Ask your caregiver about trying back exercises and gentle massage. This may be of some benefit.  Avoid feeling anxious or stressed.Stress increases muscle tension and can worsen back pain.It is important to recognize when you are anxious or stressed and learn ways to manage it.Exercise is a great option. SEEK MEDICAL CARE IF:  You have pain that is not relieved with rest or medicine.  You have pain that does not improve in 1 week.  You have new symptoms.  You are generally not feeling well. SEEK IMMEDIATE MEDICAL CARE IF:   You have pain that radiates from your back into your legs.  You develop new bowel or bladder control problems.  You have unusual weakness or numbness in your arms or legs.  You develop nausea or vomiting.  You develop abdominal pain.  You feel faint. Document Released: 01/29/2005 Document Revised: 07/31/2011 Document Reviewed: 06/02/2013 [facility] Patient Information 2015 [place], [place]. This information is not intended to replace advice given to you by your health care provider. Make sure you discuss any questions you have with your health care provider.

## 2014-01-18 ENCOUNTER — Encounter (HOSPITAL_BASED_OUTPATIENT_CLINIC_OR_DEPARTMENT_OTHER): Payer: Self-pay | Admitting: Emergency Medicine

## 2014-01-18 ENCOUNTER — Emergency Department (HOSPITAL_BASED_OUTPATIENT_CLINIC_OR_DEPARTMENT_OTHER)
Admission: EM | Admit: 2014-01-18 | Discharge: 2014-01-19 | Disposition: A | Discharge status: Home / Self Care | Payer: Self-pay | Attending: Emergency Medicine | Admitting: Emergency Medicine

## 2014-01-18 ENCOUNTER — Emergency Department (HOSPITAL_BASED_OUTPATIENT_CLINIC_OR_DEPARTMENT_OTHER): Payer: Self-pay

## 2014-01-18 DIAGNOSIS — R52 Pain, unspecified: Secondary | ICD-10-CM

## 2014-01-18 DIAGNOSIS — M549 Dorsalgia, unspecified: Secondary | ICD-10-CM | POA: Insufficient documentation

## 2014-01-18 DIAGNOSIS — K2970 Gastritis, unspecified, without bleeding: Secondary | ICD-10-CM | POA: Insufficient documentation

## 2014-01-18 MED ORDER — GI COCKTAIL ~~LOC~~
30.0000 mL | Freq: Once | ORAL | Status: AC
  Administered 2014-01-19: 30 mL via ORAL
  Filled 2014-01-18: qty 30

## 2014-01-18 NOTE — ED Notes (Signed)
Pt states that last Thursday at work he started having CP, back pain and on Sunday he called here and they advised him to come in but his wife was asleep and he just went back to bed.
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
## 2014-01-19 LAB — CBC WITH DIFFERENTIAL/PLATELET
BASOS ABS: 0 10*3/uL (ref 0.0–0.1)
Basophils Relative: 0 % (ref 0–1)
Eosinophils Absolute: 0.2 10*3/uL (ref 0.0–0.7)
Eosinophils Relative: 2 % (ref 0–5)
HEMATOCRIT: 40.7 % (ref 39.0–52.0)
Hemoglobin: 13.8 g/dL (ref 13.0–17.0)
Lymphocytes Relative: 33 % (ref 12–46)
Lymphs Abs: 3.6 10*3/uL (ref 0.7–4.0)
MCH: 30.3 pg (ref 26.0–34.0)
MCHC: 33.9 g/dL (ref 30.0–36.0)
MCV: 89.5 fL (ref 78.0–100.0)
MONO ABS: 1.2 10*3/uL — AB (ref 0.1–1.0)
Monocytes Relative: 11 % (ref 3–12)
NEUTROS ABS: 5.8 10*3/uL (ref 1.7–7.7)
NEUTROS PCT: 54 % (ref 43–77)
Platelets: 196 10*3/uL (ref 150–400)
RBC: 4.55 MIL/uL (ref 4.22–5.81)
RDW: 12.7 % (ref 11.5–15.5)
WBC: 10.7 10*3/uL — AB (ref 4.0–10.5)

## 2014-01-19 LAB — COMPREHENSIVE METABOLIC PANEL
ALBUMIN: 3.5 g/dL (ref 3.5–5.2)
ALT: 19 U/L (ref 0–53)
AST: 24 U/L (ref 0–37)
Alkaline Phosphatase: 98 U/L (ref 39–117)
Anion gap: 11 (ref 5–15)
BILIRUBIN TOTAL: 0.3 mg/dL (ref 0.3–1.2)
BUN: 12 mg/dL (ref 6–23)
CHLORIDE: 104 meq/L (ref 96–112)
CO2: 26 mEq/L (ref 19–32)
CREATININE: 0.8 mg/dL (ref 0.50–1.35)
Calcium: 8.7 mg/dL (ref 8.4–10.5)
GFR calc Af Amer: >90 mL/min (ref >90)
Glucose, Bld: 100 mg/dL — ABNORMAL HIGH (ref 70–99)
Potassium: 3.9 mEq/L (ref 3.7–5.3)
Sodium: 141 mEq/L (ref 137–147)
Total Protein: 7.1 g/dL (ref 6.0–8.3)

## 2014-01-19 LAB — LIPASE, BLOOD: LIPASE: 33 U/L (ref 11–59)

## 2014-01-19 MED ORDER — SUCRALFATE 1 GM/10ML PO SUSP: 1.0000 g | Freq: Three times a day (TID) | ORAL | Status: DC

## 2014-01-19 MED ORDER — OMEPRAZOLE 40 MG PO CPDR: 40.0000 mg | DELAYED_RELEASE_CAPSULE | Freq: Every day | ORAL | Status: DC

## 2014-01-22 ENCOUNTER — Encounter (HOSPITAL_COMMUNITY): Payer: Self-pay | Admitting: Emergency Medicine

## 2014-01-22 ENCOUNTER — Emergency Department (HOSPITAL_COMMUNITY)
Admission: EM | Admit: 2014-01-22 | Discharge: 2014-01-22 | Disposition: A | Discharge status: Home / Self Care | Payer: Self-pay | Attending: Emergency Medicine | Admitting: Emergency Medicine

## 2014-01-22 DIAGNOSIS — R079 Chest pain, unspecified: Secondary | ICD-10-CM | POA: Insufficient documentation

## 2014-01-22 DIAGNOSIS — Z79899 Other long term (current) drug therapy: Secondary | ICD-10-CM | POA: Insufficient documentation

## 2014-01-22 DIAGNOSIS — R55 Syncope and collapse: Secondary | ICD-10-CM | POA: Insufficient documentation

## 2014-01-22 DIAGNOSIS — K219 Gastro-esophageal reflux disease without esophagitis: Secondary | ICD-10-CM | POA: Insufficient documentation

## 2014-01-22 LAB — CBC WITH DIFFERENTIAL/PLATELET
BASOS PCT: 1 % (ref 0–1)
Basophils Absolute: 0 10*3/uL (ref 0.0–0.1)
EOS PCT: 1 % (ref 0–5)
Eosinophils Absolute: 0.1 10*3/uL (ref 0.0–0.7)
HEMATOCRIT: 42.3 % (ref 39.0–52.0)
HEMOGLOBIN: 14.7 g/dL (ref 13.0–17.0)
Lymphocytes Relative: 32 % (ref 12–46)
Lymphs Abs: 1.7 10*3/uL (ref 0.7–4.0)
MCH: 30.1 pg (ref 26.0–34.0)
MCHC: 34.8 g/dL (ref 30.0–36.0)
MCV: 86.5 fL (ref 78.0–100.0)
MONO ABS: 0.5 10*3/uL (ref 0.1–1.0)
MONOS PCT: 10 % (ref 3–12)
NEUTROS ABS: 3 10*3/uL (ref 1.7–7.7)
Neutrophils Relative %: 56 % (ref 43–77)
Platelets: 214 10*3/uL (ref 150–400)
RBC: 4.89 MIL/uL (ref 4.22–5.81)
RDW: 12.5 % (ref 11.5–15.5)
WBC: 5.3 10*3/uL (ref 4.0–10.5)

## 2014-01-22 LAB — BASIC METABOLIC PANEL
Anion gap: 12 (ref 5–15)
BUN: 8 mg/dL (ref 6–23)
CALCIUM: 9 mg/dL (ref 8.4–10.5)
CHLORIDE: 105 meq/L (ref 96–112)
CO2: 22 mEq/L (ref 19–32)
CREATININE: 0.7 mg/dL (ref 0.50–1.35)
GFR calc Af Amer: >90 mL/min (ref >90)
GFR calc non Af Amer: >90 mL/min (ref >90)
Glucose, Bld: 103 mg/dL — ABNORMAL HIGH (ref 70–99)
Potassium: 4.5 mEq/L (ref 3.7–5.3)
Sodium: 139 mEq/L (ref 137–147)

## 2014-01-22 LAB — I-STAT TROPONIN, ED: Troponin i, poc: 0 ng/mL (ref 0.00–0.08)

## 2014-01-22 LAB — D-DIMER, QUANTITATIVE (NOT AT ARMC): D-Dimer, Quant: <0.27 ug/mL-FEU (ref 0.00–0.48)

## 2014-01-22 MED ORDER — SODIUM CHLORIDE 0.9 % IV BOLUS (SEPSIS)
1000.0000 mL | Freq: Once | INTRAVENOUS | Status: AC
  Administered 2014-01-22: 1000 mL via INTRAVENOUS
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
## 2014-01-22 NOTE — Discharge Instructions (Signed)
Syncope °Syncope is a medical term for fainting or passing out. This means you lose consciousness and drop to the ground. People are generally unconscious for less than 5 minutes. You may have some muscle twitches for up to 15 seconds before waking up and returning to normal. Syncope occurs more often in older adults, but it can happen to anyone. While most causes of syncope are not dangerous, syncope can be a sign of a serious medical problem. It is important to seek medical care.  °CAUSES  °Syncope is caused by a sudden drop in blood flow to the brain. The specific cause is often not determined. Factors that can bring on syncope include: °· Taking medicines that lower blood pressure. °· Sudden changes in posture, such as standing up quickly. °· Taking more medicine than prescribed. °· Standing in one place for too long. °· Seizure disorders. °· Dehydration and excessive exposure to heat. °· Low blood sugar (hypoglycemia). °· Straining to have a bowel movement. °· Heart disease, irregular heartbeat, or other circulatory problems. °· Fear, emotional distress, seeing blood, or severe pain. °SYMPTOMS  °Right before fainting, you may: °· Feel dizzy or light-headed. °· Feel nauseous. °· See all white or all black in your field of vision. °· Have cold, clammy skin. °DIAGNOSIS  °Your health care provider will ask about your symptoms, perform a physical exam, and perform an electrocardiogram (ECG) to record the electrical activity of your heart. Your health care provider may also perform other heart or blood tests to determine the cause of your syncope which may include: °· Transthoracic echocardiogram (TTE). During echocardiography, sound waves are used to evaluate how blood flows through your heart. °· Transesophageal echocardiogram (TEE). °· Cardiac monitoring. This allows your health care provider to monitor your heart rate and rhythm in real time. °· Holter monitor. This is a portable device that records your  heartbeat and can help diagnose heart arrhythmias. It allows your health care provider to track your heart activity for several days, if needed. °· Stress tests by exercise or by giving medicine that makes the heart beat faster. °TREATMENT  °In most cases, no treatment is needed. Depending on the cause of your syncope, your health care provider may recommend changing or stopping some of your medicines. °HOME CARE INSTRUCTIONS °· Have someone stay with you until you feel stable. °· Do not drive, use machinery, or play sports until your health care provider says it is okay. °· Keep all follow-up appointments as directed by your health care provider. °· Lie down right away if you start feeling like you might faint. Breathe deeply and steadily. Wait until all the symptoms have passed. °· Drink enough fluids to keep your urine clear or pale yellow. °· If you are taking blood pressure or heart medicine, get up slowly and take several minutes to sit and then stand. This can reduce dizziness. °SEEK IMMEDIATE MEDICAL CARE IF:  °· You have a severe headache. °· You have unusual pain in the chest, abdomen, or back. °· You are bleeding from your mouth or rectum, or you have black or tarry stool. °· You have an irregular or very fast heartbeat. °· You have pain with breathing. °· You have repeated fainting or seizure-like jerking during an episode. °· You faint when sitting or lying down. °· You have confusion. °· You have trouble walking. °· You have severe weakness. °· You have vision problems. °If you fainted, call your local emergency services (911 in [place] Do not drive   yourself to the hospital.  °MAKE SURE YOU: °· Understand these instructions. °· Will watch your condition. °· Will get help right away if you are not doing well or get worse. °Document Released: 01/29/2005 Document Revised: 02/03/2013 Document Reviewed: 03/30/2011 °Exit[employer]® Patient Information ©2015 [employer]. This information is not intended to replace  advice given to you by your health care provider. Make sure you discuss any questions you have with your health care provider. ° °

## 2014-01-22 NOTE — ED Notes (Signed)
EDP at bedside  

## 2014-01-22 NOTE — ED Provider Notes (Signed)
Patient [facility] in the Emergency Department [facility] Patient reports chest pain/syncope Exam : awake/alert, no murmurs noted, vitals unremarkable Plan: d-dimer to r/o PE No family h/o sudden death No h/o exertional syncope previously EKG reviewed Recent CXR reviewed and negative    Joya Gaskinsonald W Brailee Riede, MD 01/22/14 1251
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
## 2014-04-06 ENCOUNTER — Emergency Department (HOSPITAL_BASED_OUTPATIENT_CLINIC_OR_DEPARTMENT_OTHER)
Admission: EM | Admit: 2014-04-06 | Discharge: 2014-04-06 | Disposition: A | Discharge status: Home / Self Care | Payer: BLUE CROSS/BLUE SHIELD | Attending: Emergency Medicine | Admitting: Emergency Medicine

## 2014-04-06 ENCOUNTER — Encounter (HOSPITAL_BASED_OUTPATIENT_CLINIC_OR_DEPARTMENT_OTHER): Payer: Self-pay | Admitting: *Deleted

## 2014-04-06 DIAGNOSIS — M62830 Muscle spasm of back: Secondary | ICD-10-CM | POA: Insufficient documentation

## 2014-04-06 DIAGNOSIS — Y9289 Other specified places as the place of occurrence of the external cause: Secondary | ICD-10-CM | POA: Diagnosis not present

## 2014-04-06 DIAGNOSIS — Y998 Other external cause status: Secondary | ICD-10-CM | POA: Diagnosis not present

## 2014-04-06 DIAGNOSIS — Z79899 Other long term (current) drug therapy: Secondary | ICD-10-CM | POA: Diagnosis not present

## 2014-04-06 DIAGNOSIS — W01198A Fall on same level from slipping, tripping and stumbling with subsequent striking against other object, initial encounter: Secondary | ICD-10-CM | POA: Insufficient documentation

## 2014-04-06 DIAGNOSIS — S3992XA Unspecified injury of lower back, initial encounter: Secondary | ICD-10-CM | POA: Insufficient documentation

## 2014-04-06 DIAGNOSIS — Y9389 Activity, other specified: Secondary | ICD-10-CM | POA: Diagnosis not present

## 2014-04-06 DIAGNOSIS — M62838 Other muscle spasm: Secondary | ICD-10-CM

## 2014-04-06 MED ORDER — NAPROXEN 250 MG PO TABS
500.0000 mg | ORAL_TABLET | Freq: Once | ORAL | Status: AC
  Administered 2014-04-06: 500 mg via ORAL
  Filled 2014-04-06: qty 2

## 2014-04-06 MED ORDER — NAPROXEN 375 MG PO TABS: 375.0000 mg | ORAL_TABLET | Freq: Two times a day (BID) | ORAL | Status: DC

## 2014-04-06 MED ORDER — METHOCARBAMOL 500 MG PO TABS: 500.0000 mg | ORAL_TABLET | Freq: Two times a day (BID) | ORAL | Status: DC

## 2014-04-06 MED ORDER — METHOCARBAMOL 500 MG PO TABS
1000.0000 mg | ORAL_TABLET | Freq: Once | ORAL | Status: AC
  Administered 2014-04-06: 1000 mg via ORAL
  Filled 2014-04-06: qty 2
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
## 2014-04-06 NOTE — ED Notes (Signed)
Fell 2 days a go from [employer]  C/o left lower back pain

## 2014-04-06 NOTE — Discharge Instructions (Signed)
Heat Therapy °Heat therapy can help make painful, stiff muscles and joints feel better. Do not use heat on new injuries. Wait at least 48 hours after an injury to use heat. Do not use heat when you have aches or pains right after an activity. If you still have pain 3 hours after stopping the activity, then you may use heat. °HOME CARE °Wet heat pack °· Soak a clean towel in warm water. Squeeze out the extra water. °· Put the warm, wet towel in a plastic bag. °· Place a thin, dry towel between your skin and the bag. °· Put the heat pack on the area for 5 minutes, and check your skin. Your skin may be pink, but it should not be red. °· Leave the heat pack on the area for 15 to 30 minutes. °· Repeat this every 2 to 4 hours while awake. Do not use heat while you are sleeping. °Warm water bath °· Fill a tub with warm water. °· Place the affected body part in the tub. °· Soak the area for 20 to 40 minutes. °· Repeat as needed. °Hot water bottle °· Fill the water bottle half full with hot water. °· Press out the extra air. Close the cap tightly. °· Place a dry towel between your skin and the bottle. °· Put the bottle on the area for 5 minutes, and check your skin. Your skin may be pink, but it should not be red. °· Leave the bottle on the area for 15 to 30 minutes. °· Repeat this every 2 to 4 hours while awake. °Electric heating pad °· Place a dry towel between your skin and the heating pad. °· Set the heating pad on low heat. °· Put the heating pad on the area for 10 minutes, and check your skin. Your skin may be pink, but it should not be red. °· Leave the heating pad on the area for 20 to 40 minutes. °· Repeat this every 2 to 4 hours while awake. °· Do not lie on the heating pad. °· Do not fall asleep while using the heating pad. °· Do not use the heating pad near water. °GET HELP RIGHT AWAY IF: °· You get blisters or red skin. °· Your skin is puffy (swollen), or you lose feeling (numbness) in the affected area. °· You  have any new problems. °· Your problems are getting worse. °· You have any questions or concerns. °If you have any problems, stop using heat therapy until you see your doctor. °MAKE SURE YOU: °· Understand these instructions. °· Will watch your condition. °· Will get help right away if you are not doing well or get worse. °Document Released: 04/23/2011 Document Reviewed: 03/24/2013 °ExitCare® Patient Information ©2015 [employer]. This information is not intended to replace advice given to you by your health care provider. Make sure you discuss any questions you have with your health care provider. ° °

## 2015-01-18 ENCOUNTER — Encounter (HOSPITAL_BASED_OUTPATIENT_CLINIC_OR_DEPARTMENT_OTHER): Payer: Self-pay | Admitting: *Deleted

## 2015-01-18 ENCOUNTER — Emergency Department (HOSPITAL_BASED_OUTPATIENT_CLINIC_OR_DEPARTMENT_OTHER)
Admission: EM | Admit: 2015-01-18 | Discharge: 2015-01-18 | Disposition: A | Discharge status: Home / Self Care | Payer: BLUE CROSS/BLUE SHIELD | Attending: Emergency Medicine | Admitting: Emergency Medicine

## 2015-01-18 DIAGNOSIS — B356 Tinea cruris: Secondary | ICD-10-CM | POA: Insufficient documentation

## 2015-01-18 MED ORDER — NYSTATIN 100000 UNIT/GM EX POWD
CUTANEOUS | Status: AC
Start: 2015-01-18 — End: ?

## 2015-01-18 NOTE — Discharge Instructions (Signed)
Nystatin powder 3 times daily.  Keep the area clean and as dry as possible.  Follow-up with your Dr. if not improving in the next week.   Jock Itch Jock itch (tinea cruris) is a fungal infection of the skin in the groin area. It is sometimes called ringworm, even though it is not caused by worms. It is caused by a fungus, which is a type of germ that thrives in dark, damp places. Jock itch causes a rash and itching in the groin and upper thigh area. It usually goes away in 2-3 weeks with treatment. CAUSES The fungus that causes jock itch may be spread by:  Touching a fungus infection elsewhere on your body--such as athlete's foot--and then touching your groin area.  Sharing towels or clothing with an infected person. RISK FACTORS Jock itch is most common in men and adolescent boys. This condition is more likely to develop from:  Being in hot, humid climates.  Wearing tight-fitting clothing or wet bathing suits for long periods of time.  Participating in sports.  Being overweight.  Having diabetes. SYMPTOMS Symptoms of jock itch may include:  A red, pink, or brown rash in the groin area. The rash may spread to the thighs, anus, and buttocks.  Dry and scaly skin on or around the rash.  Itchiness. DIAGNOSIS Most often, a health care provider can make the diagnosis by looking at your rash. Sometimes, a scraping of the infected skin will be taken. This sample may be tested by looking at it under a microscope or by trying to grow the fungus from the sample (culture).  TREATMENT Treatment for this condition may include:  Antifungal medicine to kill the fungus. This may be in various forms:  Skin cream or ointment.  Medicine taken by mouth.  Skin cream or ointment to reduce the itching.  Compresses or medicated powders to dry the infected skin. HOME CARE INSTRUCTIONS  Take medicines only as directed by your health care provider. Apply skin creams or ointments exactly as  directed.  Wear loose-fitting clothing.  Men should wear cotton boxer shorts.  Women should wear cotton underwear.  Change your underwear every day to keep your groin dry.  Avoid hot baths.  Dry your groin area well after bathing.  Use a separate towel to dry your groin area. This will help to prevent a spreading of the infection to other areas of your body.  Do not scratch the affected area.  Do not share towels with other people. SEEK MEDICAL CARE IF:  Your rash does not improve or it gets worse after 2 weeks of treatment.  Your rash is spreading.  Your rash returns after treatment is finished.  You have a fever.  You have redness, swelling, or pain in the area around your rash.  You have fluid, blood, or pus coming from your rash.  Your have your rash for more than 4 weeks.   This information is not intended to replace advice given to you by your health care provider. Make sure you discuss any questions you have with your health care provider.   Document Released: 01/19/2002 Document Revised: 02/19/2014 Document Reviewed: 11/10/2013 Elsevier Interactive Patient Education [employer]2016 [employer] Inc.

## 2015-01-18 NOTE — ED Provider Notes (Addendum)
CSN: [id number]     Arrival date & time 01/18/15  1021 History   First MD Initiated Contact with Patient 01/18/15 1111     No chief complaint on file.    (Consider location/radiation/quality/duration/timing/severity/associated sxs/prior Treatment) HPI Comments: Patient is a 29 year old male with no significant past medical history. He presents for evaluation of rash to his groin. This started several days ago. He recently found out his significant other has been unfaithful and he is concerned about an STD. He denies any rash to his genitalia. He denies any discharge from his urethra. He denies any burning with urination  Patient is a 29 y.o. male presenting with rash. The history is provided by the patient.  Rash Location: Groin. Quality: itchiness, painful and redness   Pain details:    Severity:  Moderate   Onset quality:  Gradual   Duration:  3 days   Timing:  Constant   Progression:  Worsening Severity:  Moderate Timing:  Constant Progression:  Worsening Chronicity:  New   History reviewed. No pertinent past medical history. Past Surgical History  Procedure Laterality Date  . Eye surgery    . Mrsa     No family history on file. Social History  Substance Use Topics  . Smoking status: Never Smoker   . Smokeless tobacco: None  . Alcohol Use: No    Review of Systems  Skin: Positive for rash.  All other systems reviewed and are negative.     Allergies  Mushroom extract complex  Home Medications   Prior to Admission medications   Not on File   BP 150/77 mmHg  Pulse 78  Temp(Src) 98.8 F (37.1 C) (Oral)  Resp 16  Ht 5\' 10"  (1.778 m)  Wt 208 lb (94.348 kg)  BMI 29.84 kg/m2  SpO2 99% Physical Exam  Constitutional: He is oriented to person, place, and time. He appears well-developed and well-nourished. No distress.  HENT:  Head: Normocephalic and atraumatic.  Neck: Normal range of motion. Neck supple.  Genitourinary:  The penis and testicles appear  normal. There is no urethral discharge.  Neurological: He is alert and oriented to person, place, and time.  Skin: Skin is warm and dry. He is not diaphoretic.  There is a erythematous rash and breakdown to the skin of both groins.  Nursing note and vitals reviewed.   ED Course  Procedures (including critical care time) Labs Review Labs Reviewed - No data to display  Imaging Review No results found. I have personally reviewed and evaluated these images and lab results as part of my medical decision-making.   EKG Interpretation None      MDM   Final diagnoses:  None    This appears to be a fungal skin infection in the skin fold of the groin. 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 He'll be treated with nystatin powder, good hygiene and drying the area out, and when necessary return.    Geoffery Lyonsouglas Kaj Vasil, MD 01/18/15 1120  Patient reports no signs or symptoms of a sexually transmitted disease. I did offer to test for gonorrhea and chlamydia even though he is not having the classic symptoms. He is declining this at this time. He agrees to return if he develops these symptoms.  Geoffery Lyonsouglas Keaisha Sublette, MD 01/18/15 1125

## 2015-01-18 NOTE — ED Notes (Signed)
C/o rash on inner thighs. C/o itching. States his partner was with some one else. No discharge.

## 2015-06-13 ENCOUNTER — Emergency Department (HOSPITAL_COMMUNITY)
Admission: EM | Admit: 2015-06-13 | Discharge: 2015-06-13 | Disposition: A | Discharge status: Home / Self Care | Payer: BLUE CROSS/BLUE SHIELD | Attending: Emergency Medicine | Admitting: Emergency Medicine

## 2015-06-13 ENCOUNTER — Encounter (HOSPITAL_COMMUNITY): Payer: Self-pay | Admitting: Emergency Medicine

## 2015-06-13 DIAGNOSIS — S3991XA Unspecified injury of abdomen, initial encounter: Secondary | ICD-10-CM | POA: Diagnosis not present

## 2015-06-13 DIAGNOSIS — S4992XA Unspecified injury of left shoulder and upper arm, initial encounter: Secondary | ICD-10-CM | POA: Diagnosis not present

## 2015-06-13 DIAGNOSIS — Y998 Other external cause status: Secondary | ICD-10-CM | POA: Diagnosis not present

## 2015-06-13 DIAGNOSIS — Y9389 Activity, other specified: Secondary | ICD-10-CM | POA: Diagnosis not present

## 2015-06-13 DIAGNOSIS — Y92410 Unspecified street and highway as the place of occurrence of the external cause: Secondary | ICD-10-CM | POA: Insufficient documentation
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
## 2023-05-15 ENCOUNTER — Emergency Department (HOSPITAL_COMMUNITY)
Admission: EM | Admit: 2023-05-15 | Discharge: 2023-05-16 | Disposition: A | Discharge status: Home / Self Care | Payer: Self-pay | Attending: Emergency Medicine | Admitting: Emergency Medicine

## 2023-05-15 ENCOUNTER — Emergency Department (HOSPITAL_COMMUNITY): Payer: Self-pay

## 2023-05-15 ENCOUNTER — Other Ambulatory Visit: Payer: Self-pay

## 2023-05-15 ENCOUNTER — Encounter (HOSPITAL_COMMUNITY): Payer: Self-pay | Admitting: *Deleted

## 2023-05-15 DIAGNOSIS — R079 Chest pain, unspecified: Secondary | ICD-10-CM

## 2023-05-15 DIAGNOSIS — R1084 Generalized abdominal pain: Secondary | ICD-10-CM

## 2023-05-15 DIAGNOSIS — R197 Diarrhea, unspecified: Secondary | ICD-10-CM | POA: Insufficient documentation

## 2023-05-15 DIAGNOSIS — R112 Nausea with vomiting, unspecified: Secondary | ICD-10-CM
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
## 2023-05-15 NOTE — ED Triage Notes (Addendum)
 Pt says this morning he started with pain all over in his abdomen and vomiting ("some bright red, some black"/diarrhea. Hx of ulcers. Denies fevers. Went to work, came home and tried to rest. He continued to having vomiting and diarrhea. Onset of central chest pain "burning, sharp" this afternoon, associated with SOB. C/o sharp pains in his legs.

## 2023-05-15 NOTE — ED Notes (Signed)
 Patient transported to X-ray

## 2023-05-16 ENCOUNTER — Emergency Department (HOSPITAL_COMMUNITY): Payer: Self-pay

## 2023-05-16 LAB — CBC
HCT: 44.1 % (ref 39.0–52.0)
Hemoglobin: 15.5 g/dL (ref 13.0–17.0)
MCH: 30.3 pg (ref 26.0–34.0)
MCHC: 35.1 g/dL (ref 30.0–36.0)
MCV: 86.1 fL (ref 80.0–100.0)
Platelets: 223 10*3/uL (ref 150–400)
RBC: 5.12 MIL/uL (ref 4.22–5.81)
RDW: 13 % (ref 11.5–15.5)
WBC: 7.5 10*3/uL (ref 4.0–10.5)
nRBC: 0 % (ref 0.0–0.2)

## 2023-05-16 LAB — URINALYSIS, ROUTINE W REFLEX MICROSCOPIC
Bilirubin Urine: NEGATIVE
Glucose, UA: NEGATIVE mg/dL
Hgb urine dipstick: NEGATIVE
Ketones, ur: NEGATIVE mg/dL
Leukocytes,Ua: NEGATIVE
Nitrite: NEGATIVE
Protein, ur: NEGATIVE mg/dL
Specific Gravity, Urine: 1.01 (ref 1.005–1.030)
pH: 6 (ref 5.0–8.0)

## 2023-05-16 LAB — BASIC METABOLIC PANEL WITH GFR
Anion gap: 8 (ref 5–15)
BUN: 11 mg/dL (ref 6–20)
CO2: 23 mmol/L (ref 22–32)
Calcium: 8.2 mg/dL — ABNORMAL LOW (ref 8.9–10.3)
Chloride: 106 mmol/L (ref 98–111)
Creatinine, Ser: 0.94 mg/dL (ref 0.61–1.24)
GFR, Estimated: >60 mL/min (ref >60)
Glucose, Bld: 98 mg/dL (ref 70–99)
Potassium: 3.3 mmol/L — ABNORMAL LOW (ref 3.5–5.1)
Sodium: 137 mmol/L (ref 135–145)

## 2023-05-16 LAB — LIPASE, BLOOD: Lipase: 27 U/L (ref 11–51)

## 2023-05-16 LAB — TROPONIN I (HIGH SENSITIVITY)
Troponin I (High Sensitivity): 2 ng/L (ref <18)
Troponin I (High Sensitivity): <2 ng/L (ref <18)
Troponin I (High Sensitivity): <2 ng/L (ref <18)

## 2023-05-16 LAB — RESP PANEL BY RT-PCR (RSV, FLU A&B, COVID)  RVPGX2
Influenza A by PCR: NEGATIVE
Influenza B by PCR: NEGATIVE
Resp Syncytial Virus by PCR: NEGATIVE
SARS Coronavirus 2 by RT PCR: NEGATIVE

## 2023-05-16 MED ORDER — IOHEXOL 350 MG/ML SOLN
75.0000 mL | Freq: Once | INTRAVENOUS | Status: AC | PRN
  Administered 2023-05-16: 75 mL via INTRAVENOUS

## 2023-05-16 MED ORDER — ONDANSETRON HCL 4 MG/2ML IJ SOLN
4.0000 mg | Freq: Once | INTRAMUSCULAR | Status: AC
  Administered 2023-05-16: 4 mg via INTRAVENOUS
  Filled 2023-05-16: qty 2

## 2023-05-16 MED ORDER — LACTATED RINGERS IV BOLUS
1000.0000 mL | Freq: Once | INTRAVENOUS | Status: AC
  Administered 2023-05-16: 1000 mL via INTRAVENOUS

## 2023-05-16 NOTE — Discharge Instructions (Signed)
 Thank you for letting us evaluate you today.  Your lab work was all reassuring.  Your heart does not appear to be injured at this time.  Your CT scan did not show any acute abnormalities.  We provided you with antinausea and fluids here in emergency department.  Have also provided you with a prescription for antinausea to take as needed.  If you do not eat that is okay as long as you are keeping down oral hydration.  Please make sure to drink water, Gatorade, Pedialyte, chicken broth

## 2023-05-16 NOTE — ED Provider Notes (Signed)
 [place] EMERGENCY DEPARTMENT AT [facility] Provider Note   CSN: [id number] Arrival date & time: 05/15/23  2317     History  Chief Complaint  Patient presents with   Chest Pain    Lawrence Harris is a 38 y.o. male with no pertinent past medical history presents to emergency department via EMS for evaluation of abdominal pain, vomiting, diarrhea that started this morning. He had 10 episodes of vomiting and 4 episodes of diarrhea today. He reports that he has recently had increased job stress recently.  His children at home are sick with URI symptoms but no GI symptoms.  He also seeks evaluation for chest pain that started today at 2100 while he was laying next to child in bed.  He endorses that the room started spinning and he started to feel middle and left side chest pain.  Initially, it was described as tight and stabbing.  He had associated bilateral leg numbness, shortness of breath, diaphoresis.  He endorses family medical history of his relatives "heart meds".  EMS reports that patient was very tachypneic and tachycardic prior to their arrival with associated paresthesia to fingers and legs. These both improved with coaching   Currently, chest pain has decreased to 2/10 following aspirin and nitro given by EMS.  Paresthesia, diaphoresis, shortness of breath have also significantly improved.   Chest Pain Associated symptoms: abdominal pain and vomiting   Associated symptoms: no cough, no dizziness, no fatigue, no fever, no headache, no nausea, no numbness, no palpitations, no shortness of breath and no weakness        Home Medications Prior to Admission medications   Medication Sig Start Date End Date Taking? Authorizing Provider  nystatin (MYCOSTATIN/NYSTOP) 100000 UNIT/GM POWD Apply locally 3 times daily. 01/18/15   Geoffery Lyons, MD      Allergies    Bee venom and Mushroom extract complex (obsolete)    Review of Systems   Review of Systems   Constitutional:  Negative for chills, fatigue and fever.  Respiratory:  Negative for cough, chest tightness, shortness of breath and wheezing.   Cardiovascular:  Positive for chest pain. Negative for palpitations.  Gastrointestinal:  Positive for abdominal pain, diarrhea and vomiting. Negative for constipation and nausea.  Neurological:  Negative for dizziness, seizures, weakness, light-headedness, numbness and headaches.    Physical Exam Updated Vital Signs BP 124/73   Pulse 88   Temp 98 F (36.7 C) (Oral)   Resp (!) 24   Ht 5\' 11"  (1.803 m)   Wt 99.8 kg   SpO2 97%   BMI 30.68 kg/m  Physical Exam Vitals and nursing note reviewed.  Constitutional:      General: He is not in acute distress.    Appearance: Normal appearance.  HENT:     Head: Normocephalic and atraumatic.  Eyes:     Conjunctiva/sclera: Conjunctivae normal.  Cardiovascular:     Rate and Rhythm: Normal rate.     Heart sounds: No murmur heard. Pulmonary:     Effort: Pulmonary effort is normal. No respiratory distress.     Breath sounds: Normal breath sounds.     Comments: Initially, mild tachypnea at 22 breaths/min.  Upon exam, breathing at 16 breaths/min with 100% oxygen saturation. Chest:     Chest wall: No tenderness.  Abdominal:     General: Bowel sounds are normal.     Palpations: Abdomen is soft.     Tenderness: There is generalized abdominal tenderness.  Musculoskeletal:  Right lower leg: No tenderness. No edema.     Left lower leg: No tenderness. No edema.  Skin:    General: Skin is warm.     Capillary Refill: Capillary refill takes less than 2 seconds.     Coloration: Skin is not jaundiced or pale.  Neurological:     General: No focal deficit present.     Mental Status: He is alert and oriented to person, place, and time. Mental status is at baseline.     Sensory: No sensory deficit.     Gait: Gait normal.     Comments: No focal deficit.  Neurologically intact.  Ambulates without  difficulty.  Follows commands appropriately     ED Results / Procedures / Treatments   Labs (all labs ordered are listed, but only abnormal results are displayed) Labs Reviewed  BASIC METABOLIC PANEL WITH GFR - Abnormal; Notable for the following components:      Result Value   Potassium 3.3 (*)    Calcium 8.2 (*)    All other components within normal limits  RESP PANEL BY RT-PCR (RSV, FLU A&B, COVID)  RVPGX2  CBC  URINALYSIS, ROUTINE W REFLEX MICROSCOPIC  LIPASE, BLOOD  TROPONIN I (HIGH SENSITIVITY)  TROPONIN I (HIGH SENSITIVITY)  TROPONIN I (HIGH SENSITIVITY)    EKG None  Radiology CT ABDOMEN PELVIS W CONTRAST Result Date: 05/16/2023 CLINICAL DATA:  Chest pain with breathing and right upper quadrant pain. Shortness of breath. EXAM: CT ABDOMEN AND PELVIS WITH CONTRAST TECHNIQUE: Multidetector CT imaging of the abdomen and pelvis was performed using the standard protocol following bolus administration of intravenous contrast. RADIATION DOSE REDUCTION: This exam was performed according to the departmental dose-optimization program which includes automated exposure control, adjustment of the mA and/or kV according to patient size and/or use of iterative reconstruction technique. CONTRAST:  75mL OMNIPAQUE IOHEXOL 350 MG/ML SOLN COMPARISON:  None Available. FINDINGS: Lower chest: No acute abnormality. Hepatobiliary: Unremarkable liver. Normal gallbladder. No biliary dilation. Pancreas: Unremarkable. Spleen: Unremarkable. Adrenals/Urinary Tract: Normal adrenal glands. No urinary calculi or hydronephrosis. Bladder is unremarkable. Stomach/Bowel: Normal caliber large and small bowel. No bowel wall thickening. The appendix is normal.Stomach is within normal limits. Vascular/Lymphatic: No significant vascular findings are present. No enlarged abdominal or pelvic lymph nodes. Reproductive: Unremarkable. Other: No free intraperitoneal fluid or air. Musculoskeletal: No acute fracture. IMPRESSION: No  acute abnormality in the abdomen or pelvis. Electronically Signed   By: Minerva Fester M.D.   On: 05/16/2023 03:53   DG Chest 2 View Result Date: 05/16/2023 CLINICAL DATA:  Chest pain EXAM: CHEST - 2 VIEW COMPARISON:  01/18/2014 FINDINGS: The heart size and mediastinal contours are within normal limits. Both lungs are clear. The visualized skeletal structures are unremarkable. IMPRESSION: No active cardiopulmonary disease. Electronically Signed   By: Charlett Nose M.D.   On: 05/16/2023 00:02    Procedures Procedures    Medications Ordered in ED Medications  ondansetron (ZOFRAN) injection 4 mg (4 mg Intravenous Given 05/16/23 0029)  lactated ringers bolus 1,000 mL (0 mLs Intravenous Stopped 05/16/23 0130)  iohexol (OMNIPAQUE) 350 MG/ML injection 75 mL (75 mLs Intravenous Contrast Given 05/16/23 0349)    ED Course/ Medical Decision Making/ A&P                                 Medical Decision Making Amount and/or Complexity of Data Reviewed Labs: ordered. Radiology: ordered.  Risk Prescription drug management.  Patient presents to the ED for concern of nausea, vomiting, abdominal pain, chest pain, this involves an extensive number of treatment options, and is a complaint that carries with it a high risk of complications and morbidity.  The differential diagnosis includes COVID, flu, RSV, virus, gastroenteritis, food poisoning, ACS, anxiety attack, dehydration   Co morbidities that complicate the patient evaluation  See HPI   Additional history obtained:  Additional history obtained from EMS, Family, and Nursing   External records from outside source obtained and reviewed including triage RN note and EMS note   Lab Tests:  I Ordered, and personally interpreted labs.  The pertinent results include:   No leukocytosis nor anemia Potassium 3.3   Imaging Studies ordered:  I ordered imaging studies including CT abdomen pelvis with contrast I independently visualized and  interpreted imaging which showed no acute intra-abdominal abnormality I agree with the radiologist interpretation   Cardiac Monitoring:  The patient was maintained on a cardiac monitor.  I personally viewed and interpreted the cardiac monitored which showed an underlying rhythm of: NSR with no STE nor ischemia   Medicines ordered and prescription drug management:  I ordered medication including Zofran, LR for hydration and antiemetic Reevaluation of the patient after these medicines showed that the patient improved I have reviewed the patients home medicines and have made adjustments as needed    Problem List / ED Course:  Nausea, vomiting, diarrhea Abdominal pain Likely secondary to gastroenteritis, virus. Provided IVF, Zofran for symptoms.  Upon reassessment, patient is sleeping in bed and appears more comfortable CT negative for acute pathology Passed p.o. challenge here in emergency department. Discussed importance of oral hydration.  Discussed symptomatic care Chest pain Troponin x 2 negative.  EKG NSR with no ST nor ischemia noted.  Chest x-ray unremarkable Fortunately, I believe chest pain and symptoms were likely secondary to dehydration and increased stress that he is had recently.   Low heart score.  No family medical history of heart issues Will have patient follow-up with primary care provider regarding this   Reevaluation:  After the interventions noted above, I reevaluated the patient and found that they have :improved   Social Determinants of Health:  No PCP on file-provider recommendation   Dispostion:  After consideration of the diagnostic results and the patients response to treatment, I feel that the patent would benefit from outpatient management symptomatic care.   Discussed ED workup, disposition, return to ED precautions with patient who expresses understanding agrees with plan.  All questions answered to their satisfaction.  They are agreeable to  plan.  Discharge instructions provided on paperwork  Final Clinical Impression(s) / ED Diagnoses Final diagnoses:  Chest pain, unspecified type  Generalized abdominal pain  Nausea vomiting and diarrhea    Rx / DC Orders ED Discharge Orders     None         Judithann Sheen, PA 05/16/23 2595    Shon Baton, MD 05/17/23 [phone]
